# Patient Record
Sex: Female | Born: 1995 | Race: White | Hispanic: No | Marital: Single
Health system: Midwestern US, Academic
[De-identification: ages and names within clinical notes are randomized; demographics above are authoritative.]

## PROBLEM LIST (undated history)

## (undated) DIAGNOSIS — N926 Irregular menstruation, unspecified: Secondary | ICD-10-CM

## (undated) DIAGNOSIS — Z0000 Encounter for general adult medical examination without abnormal findings: Secondary | ICD-10-CM

---
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
## 2015-01-22 ENCOUNTER — Telehealth

## 2015-01-22 NOTE — Telephone Encounter (Signed)
Pt calling b/c she wants to get established w/ Dr Terrance MassSB b/c several years ago her mom, Marlyn CorporalRegina Dipaola, saw her. Is this ok? Pt wants to be seen soon for possible thyroid issues.

## 2015-01-23 NOTE — Telephone Encounter (Signed)
Ok for appt 30 min    Prior to the appt please have her get:    flp  bmp   tsh with reflex please    Please order

## 2015-01-23 NOTE — Telephone Encounter (Signed)
Labs ordered and message left to call and make a new patient appointment, ok per SB

## 2015-03-04 ENCOUNTER — Emergency Department: Admit: 2015-03-04 | Payer: PRIVATE HEALTH INSURANCE

## 2015-03-04 ENCOUNTER — Inpatient Hospital Stay
Admit: 2015-03-04 | Discharge: 2015-03-04 | Disposition: A | Discharge status: Home / Self Care | Payer: PRIVATE HEALTH INSURANCE

## 2015-03-04 DIAGNOSIS — M546 Pain in thoracic spine: Secondary | ICD-10-CM

## 2015-03-04 MED ORDER — cyclobenzaprine (FLEXERIL) 10 MG tablet
10 | ORAL_TABLET | Freq: Three times a day (TID) | ORAL | Status: AC | PRN
Start: 2015-03-04 — End: ?

## 2015-03-04 MED ORDER — diazePAM (VALIUM) tablet 5 mg
5 | Freq: Once | ORAL | Status: AC
Start: 2015-03-04 — End: 2015-03-04
  Administered 2015-03-04: 18:00:00 5 mg via ORAL

## 2015-03-04 MED ORDER — ketorolac (TORADOL) injection 15 mg
15 | Freq: Once | INTRAMUSCULAR | Status: AC
Start: 2015-03-04 — End: 2015-03-04
  Administered 2015-03-04: 18:00:00 15 mg via INTRAMUSCULAR

## 2015-03-04 MED FILL — KETOROLAC 15 MG/ML INJECTION SOLUTION: 15 15 mg/mL | INTRAMUSCULAR | Qty: 1

## 2015-03-04 MED FILL — DIAZEPAM 5 MG TABLET: 5 5 MG | ORAL | Qty: 1
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
## 2017-08-04 NOTE — Telephone Encounter (Signed)
Scrubbing Chart regarding attribution list. Patient is no longer with [facility].          Boyde Grieco  Care Coordinator  [phone]
